# Patient Record
Sex: Female | Born: 1980 | Race: Black or African American | Hispanic: No | Marital: Married | Smoking: Never smoker
Health system: Southern US, Community
[De-identification: ages and names within clinical notes are randomized; demographics above are authoritative.]

## PROBLEM LIST (undated history)

## (undated) DIAGNOSIS — O24419 Gestational diabetes mellitus in pregnancy, unspecified control: Secondary | ICD-10-CM

## (undated) HISTORY — PX: TONSILLECTOMY: SUR1361

---

## 2012-03-11 ENCOUNTER — Encounter (HOSPITAL_COMMUNITY): Payer: Self-pay | Admitting: *Deleted

## 2012-03-11 ENCOUNTER — Emergency Department (HOSPITAL_COMMUNITY): Payer: BC Managed Care – PPO

## 2012-03-11 ENCOUNTER — Emergency Department (HOSPITAL_COMMUNITY)
Admission: EM | Admit: 2012-03-11 | Discharge: 2012-03-11 | Disposition: A | Discharge status: Home / Self Care | Payer: BC Managed Care – PPO | Attending: Emergency Medicine | Admitting: Emergency Medicine

## 2012-03-11 DIAGNOSIS — R1110 Vomiting, unspecified: Secondary | ICD-10-CM

## 2012-03-11 DIAGNOSIS — J40 Bronchitis, not specified as acute or chronic: Secondary | ICD-10-CM

## 2012-03-11 DIAGNOSIS — R000 Tachycardia, unspecified: Secondary | ICD-10-CM

## 2012-03-11 DIAGNOSIS — Z3202 Encounter for pregnancy test, result negative: Secondary | ICD-10-CM | POA: Insufficient documentation

## 2012-03-11 DIAGNOSIS — R197 Diarrhea, unspecified: Secondary | ICD-10-CM

## 2012-03-11 DIAGNOSIS — R112 Nausea with vomiting, unspecified: Secondary | ICD-10-CM

## 2012-03-11 DIAGNOSIS — R05 Cough: Secondary | ICD-10-CM | POA: Insufficient documentation

## 2012-03-11 DIAGNOSIS — R059 Cough, unspecified: Secondary | ICD-10-CM

## 2012-03-11 LAB — CBC WITH DIFFERENTIAL/PLATELET
Basophils Relative: 0 % (ref 0–1)
Eosinophils Absolute: 0.2 10*3/uL (ref 0.0–0.7)
MCH: 29 pg (ref 26.0–34.0)
MCHC: 35.2 g/dL (ref 30.0–36.0)
Neutro Abs: 3.7 10*3/uL (ref 1.7–7.7)
Neutrophils Relative %: 54 % (ref 43–77)
Platelets: 343 10*3/uL (ref 150–400)
RBC: 4.27 MIL/uL (ref 3.87–5.11)

## 2012-03-11 LAB — BASIC METABOLIC PANEL
GFR calc Af Amer: 89 mL/min — ABNORMAL LOW (ref >90)
GFR calc non Af Amer: 77 mL/min — ABNORMAL LOW (ref >90)
Potassium: 3.2 mEq/L — ABNORMAL LOW (ref 3.5–5.1)
Sodium: 136 mEq/L (ref 135–145)

## 2012-03-11 LAB — URINALYSIS, ROUTINE W REFLEX MICROSCOPIC
Leukocytes, UA: NEGATIVE
Nitrite: NEGATIVE
Specific Gravity, Urine: >1.03 — ABNORMAL HIGH (ref 1.005–1.030)
Urobilinogen, UA: 0.2 mg/dL (ref 0.0–1.0)

## 2012-03-11 LAB — PREGNANCY, URINE: Preg Test, Ur: NEGATIVE

## 2012-03-11 LAB — URINE MICROSCOPIC-ADD ON

## 2012-03-11 MED ORDER — ALBUTEROL SULFATE HFA 108 (90 BASE) MCG/ACT IN AERS
2.0000 | INHALATION_SPRAY | Freq: Once | RESPIRATORY_TRACT | Status: AC
  Administered 2012-03-11: 2 via RESPIRATORY_TRACT
  Filled 2012-03-11: qty 6.7

## 2012-03-11 MED ORDER — ONDANSETRON 4 MG PO TBDP: ORAL_TABLET | ORAL | Status: DC

## 2012-03-11 MED ORDER — SULFAMETHOXAZOLE-TRIMETHOPRIM 800-160 MG PO TABS: 1.0000 | ORAL_TABLET | Freq: Two times a day (BID) | ORAL | Status: DC

## 2012-03-11 MED ORDER — BENZONATATE 100 MG PO CAPS: 100.0000 mg | ORAL_CAPSULE | Freq: Two times a day (BID) | ORAL | Status: DC | PRN

## 2012-03-11 MED ORDER — POTASSIUM CHLORIDE CRYS ER 20 MEQ PO TBCR
40.0000 meq | EXTENDED_RELEASE_TABLET | Freq: Once | ORAL | Status: AC
  Administered 2012-03-11: 40 meq via ORAL
  Filled 2012-03-11: qty 2

## 2012-03-11 MED ORDER — ONDANSETRON HCL 4 MG/2ML IJ SOLN
4.0000 mg | Freq: Once | INTRAMUSCULAR | Status: AC
Start: 2012-03-11 — End: 2012-03-11
  Administered 2012-03-11: 4 mg via INTRAVENOUS
  Filled 2012-03-11: qty 2

## 2012-03-11 MED ORDER — SODIUM CHLORIDE 0.9 % IV BOLUS (SEPSIS)
1000.0000 mL | Freq: Once | INTRAVENOUS | Status: AC
  Administered 2012-03-11: 1000 mL via INTRAVENOUS

## 2012-03-11 MED ORDER — ALBUTEROL SULFATE HFA 108 (90 BASE) MCG/ACT IN AERS: 2.0000 | INHALATION_SPRAY | Freq: Four times a day (QID) | RESPIRATORY_TRACT | Status: DC | PRN

## 2012-03-11 MED ORDER — DOXYCYCLINE HYCLATE 100 MG PO CAPS: 100.0000 mg | ORAL_CAPSULE | Freq: Two times a day (BID) | ORAL | Status: AC

## 2012-03-11 MED ORDER — BENZONATATE 100 MG PO CAPS
100.0000 mg | ORAL_CAPSULE | Freq: Once | ORAL | Status: AC
  Administered 2012-03-11: 100 mg via ORAL
  Filled 2012-03-11: qty 1

## 2012-03-11 NOTE — ED Notes (Signed)
Patient transported to X-ray 

## 2012-03-11 NOTE — ED Notes (Signed)
Patient returned from X-ray 

## 2012-03-11 NOTE — ED Provider Notes (Signed)
History     CSN: [id number]  Arrival date & time 03/11/12  1420   None     No chief complaint on file.   (Consider location/radiation/quality/duration/timing/severity/associated sxs/prior treatment) HPI chief complaint: Possible allergic reaction. Onset: 7 days ago. Location: Home. Worsened with clarithromycin or Biaxin. Context: The patient prescribed Biaxin for bronchitis and 5 weeks of a nonproductive cough with posttussive emesis. Patient had nonbilious nonbloody emesis and diarrhea since starting the Biaxin. For signs and symptoms the review of systems. Regarding social history see nurse's notes. I have reviewed patient's past medical, past surgical, past and social history as well as medications and allergies.  History reviewed. No pertinent past medical history.  Past Surgical History  Procedure Date  . Tonsillectomy     No family history on file.  History  Substance Use Topics  . Smoking status: Never Smoker   . Smokeless tobacco: Not on file  . Alcohol Use: No    OB History    Grav Para Term Preterm Abortions TAB SAB Ect Mult Living                  Review of Systems  Constitutional: Negative for fever and chills.  HENT: Negative for trouble swallowing, neck pain and neck stiffness.   Eyes: Negative for pain, discharge and itching.  Respiratory: Positive for cough. Negative for chest tightness and shortness of breath.   Cardiovascular: Negative for chest pain, palpitations and leg swelling.  Gastrointestinal: Positive for nausea, vomiting and diarrhea. Negative for abdominal pain, constipation, blood in stool and abdominal distention.  Genitourinary: Negative for dysuria, urgency, frequency, hematuria, flank pain, decreased urine volume, vaginal bleeding, vaginal discharge, difficulty urinating, vaginal pain, menstrual problem and pelvic pain.  Musculoskeletal: Negative for back pain and joint swelling.  Skin: Negative for rash and wound.  Neurological: Negative  for dizziness, tremors, seizures, syncope, facial asymmetry, speech difficulty, weakness, light-headedness, numbness and headaches.  Hematological: Negative for adenopathy. Does not bruise/bleed easily.  Psychiatric/Behavioral: Negative for confusion and decreased concentration.    Allergies  Review of patient's allergies indicates no known allergies.  Home Medications  No current outpatient prescriptions on file.  Pulse 114  Temp 98.2 F (36.8 C) (Oral)  Resp 24  SpO2 100%  Physical Exam  Constitutional: She is oriented to person, place, and time. She appears well-developed and well-nourished. No distress.  HENT:  Head: Normocephalic and atraumatic.  Right Ear: Hearing, tympanic membrane, external ear and ear canal normal.  Left Ear: Hearing, tympanic membrane, external ear and ear canal normal.  Nose: Nose normal.  Mouth/Throat: Uvula is midline, oropharynx is clear and moist and mucous membranes are normal.  Eyes: Conjunctivae normal are normal. Right eye exhibits no discharge. Left eye exhibits no discharge. No scleral icterus.  Neck: Normal range of motion. Neck supple.  Cardiovascular: Normal rate, regular rhythm, normal heart sounds and intact distal pulses.   No murmur heard. Pulmonary/Chest: Effort normal and breath sounds normal. No respiratory distress. She has no wheezes. She has no rales. She exhibits no tenderness.  Abdominal: Soft. Bowel sounds are normal. She exhibits no distension and no mass. There is no tenderness. There is no rebound and no guarding.  Musculoskeletal: Normal range of motion. She exhibits no tenderness.  Neurological: She is alert and oriented to person, place, and time.  Skin: Skin is warm and dry. She is not diaphoretic.  Psychiatric: She has a normal mood and affect.    ED Course  Procedures (including critical care  time)  Labs Reviewed  CBC WITH DIFFERENTIAL - Abnormal; Notable for the following:    HCT 35.2 (*)     All other  components within normal limits  BASIC METABOLIC PANEL - Abnormal; Notable for the following:    Potassium 3.2 (*)     Glucose, Bld 108 (*)     GFR calc non Af Amer 77 (*)     GFR calc Af Amer 89 (*)     All other components within normal limits  URINALYSIS, ROUTINE W REFLEX MICROSCOPIC - Abnormal; Notable for the following:    APPearance HAZY (*)     Specific Gravity, Urine >1.030 (*)     Hgb urine dipstick LARGE (*)     Protein, ur 30 (*)     All other components within normal limits  PREGNANCY, URINE  URINE MICROSCOPIC-ADD ON  CLOSTRIDIUM DIFFICILE BY PCR  BORDETELLA PERTUSSIS PCR   Dg Chest 2 View  03/11/2012  *RADIOLOGY REPORT*  Clinical Data: 32 year old female with shortness of breath and cough.  CHEST - 2 VIEW  Comparison: None  Findings: The cardiomediastinal silhouette is unremarkable. Mild peribronchial thickening is noted. There is no evidence of focal airspace disease, pulmonary edema, suspicious pulmonary nodule/mass, pleural effusion, or pneumothorax. No acute bony abnormalities are identified.  IMPRESSION: Mild peribronchial thickening / bronchitis without evidence of focal pneumonia.   Original Report Authenticated By: Harmon Pier, M.D.      1. Cough   2. Post-tussive emesis   3. Nausea vomiting and diarrhea   4. Tachycardia   5. Bronchitis       MDM  Patient is a well-appearing 32 year old female presenting with 7 days of intermittent vomiting and diarrhea immediately after she takes Biaxin which was prescribed for possible bronchitis by an urgent care provider. Patient has had 5 weeks of nonproductive cough with posttussive emesis. No other sick contacts. No history of homelessness or tuberculosis exposure. Patient did receive all of her vaccinations growing up. Pertussis unlikely but pertussis PCR sent and patient discharged with Bactrim which a second line for pertussis. First-line is a macrolide antibiotic but given patient is having possible side effects from  Biaxin macrolides were held. I do not feel the patient is having an allergic reaction. No rash, urticaria, wheezing, shortness of breath, angioedema. She may be having side effects from the medications so instructed  to the patient to stop taking the Biaxin. Zofran provided. Fluids provided. Labs largely unremarkable. Chest x-ray shows possible bronchitis. Discharged on doxycycline as well. Patient discharged to follow up with a primary care provider this week to followup the results of the pertussis test. Albuterol and Tessalon Perles further improved the patient's symptoms while in the ED. By mouth potassium also provided.        Consuello Masse, MD 03/12/12 [phone]

## 2012-03-11 NOTE — ED Notes (Signed)
PT is here with complaints of allergic reaction from antibiotic that she has been taking for 3 days.  PT reports nausea, rapid heart rate and did vomit.  PT reports projectile vomiting and diarrhea.  Started feeling better yesterday and took the antibiotic again ( Biaxin) and now her heart is racing again

## 2012-03-11 NOTE — ED Notes (Signed)
Pt states had been sick  Cough and cold since nov finally went to dr on 1/1 dx w/ bromchititis  and got biaxin rx and pred and cough meds was taking them  Til the 4th when she got violently ill w/ n/v/d cramps  And felt like her heart was racing stopped the med on the 4 th felt a little better still had diarrhea and cramps  Started biaxin last night at 8 pm and did well till she took her am dose this am and again felt sick and her heart was racing also states that her period alsways starts on the 16th but she started her period early this month

## 2012-03-12 NOTE — ED Provider Notes (Signed)
I patient presents with cough and vomiting diarrhea. Doubt this is allergic reaction, however it could be side effects from Biaxin. The patient is overall reassuring. Started on doxycycline to the chance of pertussis. Discharge home I saw and evaluated the patient, reviewed the resident's note and I agree with the findings and plan.  Juliet Rude. Rubin Payor, MD 03/12/12 1446

## 2012-03-13 LAB — BORDETELLA PERTUSSIS PCR: B pertussis, DNA: NOT DETECTED

## 2013-11-19 ENCOUNTER — Other Ambulatory Visit (HOSPITAL_COMMUNITY): Payer: Self-pay | Admitting: Specialist

## 2013-11-19 DIAGNOSIS — IMO0002 Reserved for concepts with insufficient information to code with codable children: Secondary | ICD-10-CM

## 2013-11-19 DIAGNOSIS — Z0489 Encounter for examination and observation for other specified reasons: Secondary | ICD-10-CM

## 2013-11-26 ENCOUNTER — Encounter (HOSPITAL_COMMUNITY): Payer: Self-pay

## 2013-11-26 ENCOUNTER — Other Ambulatory Visit (HOSPITAL_COMMUNITY): Payer: Self-pay | Admitting: Specialist

## 2013-11-26 ENCOUNTER — Ambulatory Visit (HOSPITAL_COMMUNITY)
Admission: RE | Admit: 2013-11-26 | Discharge: 2013-11-26 | Disposition: A | Discharge status: Home / Self Care | Payer: BC Managed Care – PPO | Source: Ambulatory Visit | Attending: Specialist | Admitting: Specialist

## 2013-11-26 VITALS — BP 127/83 | HR 106 | Wt 292.0 lb

## 2013-11-26 DIAGNOSIS — O99810 Abnormal glucose complicating pregnancy: Secondary | ICD-10-CM | POA: Diagnosis present

## 2013-11-26 DIAGNOSIS — Z1389 Encounter for screening for other disorder: Secondary | ICD-10-CM | POA: Diagnosis not present

## 2013-11-26 DIAGNOSIS — E669 Obesity, unspecified: Secondary | ICD-10-CM | POA: Insufficient documentation

## 2013-11-26 DIAGNOSIS — O24419 Gestational diabetes mellitus in pregnancy, unspecified control: Secondary | ICD-10-CM

## 2013-11-26 DIAGNOSIS — Z0489 Encounter for examination and observation for other specified reasons: Secondary | ICD-10-CM

## 2013-11-26 DIAGNOSIS — O99210 Obesity complicating pregnancy, unspecified trimester: Secondary | ICD-10-CM | POA: Diagnosis not present

## 2013-11-26 DIAGNOSIS — IMO0002 Reserved for concepts with insufficient information to code with codable children: Secondary | ICD-10-CM

## 2013-11-26 DIAGNOSIS — Z363 Encounter for antenatal screening for malformations: Secondary | ICD-10-CM | POA: Insufficient documentation

## 2013-11-26 HISTORY — DX: Gestational diabetes mellitus in pregnancy, unspecified control: O24.419

## 2013-12-24 ENCOUNTER — Other Ambulatory Visit (HOSPITAL_COMMUNITY): Payer: Self-pay | Admitting: Maternal and Fetal Medicine

## 2013-12-24 DIAGNOSIS — O24419 Gestational diabetes mellitus in pregnancy, unspecified control: Secondary | ICD-10-CM

## 2013-12-24 DIAGNOSIS — Z3689 Encounter for other specified antenatal screening: Secondary | ICD-10-CM

## 2013-12-24 DIAGNOSIS — O99212 Obesity complicating pregnancy, second trimester: Secondary | ICD-10-CM

## 2014-01-04 ENCOUNTER — Encounter (HOSPITAL_COMMUNITY): Payer: Self-pay

## 2014-01-07 ENCOUNTER — Ambulatory Visit (HOSPITAL_COMMUNITY): Payer: BC Managed Care – PPO

## 2014-10-01 ENCOUNTER — Encounter (HOSPITAL_COMMUNITY): Payer: Self-pay | Admitting: *Deleted

## 2015-07-01 ENCOUNTER — Emergency Department (HOSPITAL_COMMUNITY): Payer: BLUE CROSS/BLUE SHIELD

## 2015-07-01 ENCOUNTER — Encounter (HOSPITAL_COMMUNITY): Payer: Self-pay

## 2015-07-01 ENCOUNTER — Emergency Department (HOSPITAL_COMMUNITY)
Admission: EM | Admit: 2015-07-01 | Discharge: 2015-07-02 | Disposition: A | Discharge status: Home / Self Care | Payer: BLUE CROSS/BLUE SHIELD | Attending: Emergency Medicine | Admitting: Emergency Medicine

## 2015-07-01 DIAGNOSIS — S20219A Contusion of unspecified front wall of thorax, initial encounter: Secondary | ICD-10-CM | POA: Insufficient documentation

## 2015-07-01 DIAGNOSIS — Y92410 Unspecified street and highway as the place of occurrence of the external cause: Secondary | ICD-10-CM | POA: Diagnosis not present

## 2015-07-01 DIAGNOSIS — Z8632 Personal history of gestational diabetes: Secondary | ICD-10-CM | POA: Diagnosis not present

## 2015-07-01 DIAGNOSIS — S30811A Abrasion of abdominal wall, initial encounter: Secondary | ICD-10-CM | POA: Diagnosis not present

## 2015-07-01 DIAGNOSIS — R0789 Other chest pain: Secondary | ICD-10-CM

## 2015-07-01 DIAGNOSIS — Y9389 Activity, other specified: Secondary | ICD-10-CM | POA: Insufficient documentation

## 2015-07-01 DIAGNOSIS — S20319A Abrasion of unspecified front wall of thorax, initial encounter: Secondary | ICD-10-CM | POA: Insufficient documentation

## 2015-07-01 DIAGNOSIS — Y998 Other external cause status: Secondary | ICD-10-CM | POA: Diagnosis not present

## 2015-07-01 DIAGNOSIS — Z3202 Encounter for pregnancy test, result negative: Secondary | ICD-10-CM | POA: Diagnosis not present

## 2015-07-01 DIAGNOSIS — S301XXA Contusion of abdominal wall, initial encounter: Secondary | ICD-10-CM | POA: Insufficient documentation

## 2015-07-01 DIAGNOSIS — S299XXA Unspecified injury of thorax, initial encounter: Secondary | ICD-10-CM | POA: Diagnosis present

## 2015-07-01 LAB — BASIC METABOLIC PANEL
ANION GAP: 11 (ref 5–15)
BUN: 11 mg/dL (ref 6–20)
CALCIUM: 9.2 mg/dL (ref 8.9–10.3)
CHLORIDE: 107 mmol/L (ref 101–111)
CO2: 21 mmol/L — AB (ref 22–32)
Creatinine, Ser: 1.09 mg/dL — ABNORMAL HIGH (ref 0.44–1.00)
GFR calc non Af Amer: >60 mL/min (ref >60)
Glucose, Bld: 172 mg/dL — ABNORMAL HIGH (ref 65–99)
POTASSIUM: 3.9 mmol/L (ref 3.5–5.1)
Sodium: 139 mmol/L (ref 135–145)

## 2015-07-01 LAB — CBC
HEMATOCRIT: 38.3 % (ref 36.0–46.0)
HEMOGLOBIN: 12.6 g/dL (ref 12.0–15.0)
MCH: 28.1 pg (ref 26.0–34.0)
MCHC: 32.9 g/dL (ref 30.0–36.0)
MCV: 85.3 fL (ref 78.0–100.0)
Platelets: 358 10*3/uL (ref 150–400)
RBC: 4.49 MIL/uL (ref 3.87–5.11)
RDW: 13.2 % (ref 11.5–15.5)
WBC: 14 10*3/uL — ABNORMAL HIGH (ref 4.0–10.5)

## 2015-07-01 LAB — I-STAT TROPONIN, ED: TROPONIN I, POC: 0 ng/mL (ref 0.00–0.08)

## 2015-07-01 LAB — I-STAT BETA HCG BLOOD, ED (MC, WL, AP ONLY): I-stat hCG, quantitative: <5 m[IU]/mL (ref <5)

## 2015-07-01 MED ORDER — SODIUM CHLORIDE 0.9 % IV BOLUS (SEPSIS)
1000.0000 mL | Freq: Once | INTRAVENOUS | Status: AC
  Administered 2015-07-01: 1000 mL via INTRAVENOUS

## 2015-07-01 MED ORDER — FENTANYL CITRATE (PF) 100 MCG/2ML IJ SOLN
50.0000 ug | INTRAMUSCULAR | Status: DC | PRN
  Administered 2015-07-01: 50 ug via INTRAVENOUS
  Filled 2015-07-01 (×2): qty 2
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
## 2015-07-02 ENCOUNTER — Emergency Department (HOSPITAL_COMMUNITY): Payer: BLUE CROSS/BLUE SHIELD

## 2015-07-02 MED ORDER — IOPAMIDOL (ISOVUE-300) INJECTION 61%
INTRAVENOUS | Status: AC
  Administered 2015-07-02: 100 mL
  Filled 2015-07-02: qty 100

## 2015-07-02 MED ORDER — NAPROXEN 500 MG PO TABS: 500.0000 mg | ORAL_TABLET | Freq: Two times a day (BID) | ORAL | Status: AC

## 2015-07-02 NOTE — Discharge Instructions (Signed)
If you were given medicines take as directed.  If you are on coumadin or contraceptives realize their levels and effectiveness is altered by many different medicines.  If you have any reaction (rash, tongues swelling, other) to the medicines stop taking and see a physician.    If your blood pressure was elevated in the ER make sure you follow up for management with a primary doctor or return for blood in stools, worsening abdominal pain, chest pain, shortness of breath or stroke symptoms.  Please follow up as directed and return to the ER or see a physician for new or worsening symptoms.  Thank you. Filed Vitals:   07/01/15 2345 07/02/15 0000 07/02/15 0015 07/02/15 0030  BP: 127/84 123/86 123/86 124/87  Pulse: 88 88 90 87  Temp:      TempSrc:      Resp: 19 22 17 14   SpO2: 99% 99% 99% 100%
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
